# Patient Record
Sex: Male | Born: 1985 | Race: Black or African American | Hispanic: No | Marital: Single | Smoking: Current every day smoker
Health system: Southern US, Community
[De-identification: ages and names within clinical notes are randomized; demographics above are authoritative.]

## PROBLEM LIST (undated history)

## (undated) DIAGNOSIS — F32A Depression, unspecified: Secondary | ICD-10-CM

## (undated) DIAGNOSIS — K649 Unspecified hemorrhoids: Secondary | ICD-10-CM

## (undated) DIAGNOSIS — F419 Anxiety disorder, unspecified: Secondary | ICD-10-CM

## (undated) DIAGNOSIS — F329 Major depressive disorder, single episode, unspecified: Secondary | ICD-10-CM

## (undated) HISTORY — PX: CARDIAC SURGERY: SHX584

---
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
## 2008-06-17 ENCOUNTER — Emergency Department (HOSPITAL_COMMUNITY): Admission: EM | Admit: 2008-06-17 | Discharge: 2008-06-17 | Payer: Self-pay | Admitting: Emergency Medicine

## 2011-12-22 ENCOUNTER — Emergency Department (HOSPITAL_COMMUNITY)
Admission: EM | Admit: 2011-12-22 | Discharge: 2011-12-22 | Disposition: A | Discharge status: Home / Self Care | Payer: No Typology Code available for payment source | Attending: Emergency Medicine | Admitting: Emergency Medicine

## 2011-12-22 ENCOUNTER — Emergency Department (HOSPITAL_COMMUNITY): Payer: No Typology Code available for payment source

## 2011-12-22 ENCOUNTER — Encounter (HOSPITAL_COMMUNITY): Payer: Self-pay | Admitting: Emergency Medicine

## 2011-12-22 DIAGNOSIS — M549 Dorsalgia, unspecified: Secondary | ICD-10-CM

## 2011-12-22 DIAGNOSIS — M545 Low back pain, unspecified: Secondary | ICD-10-CM | POA: Insufficient documentation

## 2011-12-22 DIAGNOSIS — Y9389 Activity, other specified: Secondary | ICD-10-CM | POA: Insufficient documentation

## 2011-12-22 DIAGNOSIS — Y92410 Unspecified street and highway as the place of occurrence of the external cause: Secondary | ICD-10-CM | POA: Insufficient documentation

## 2011-12-22 DIAGNOSIS — M542 Cervicalgia: Secondary | ICD-10-CM

## 2011-12-22 DIAGNOSIS — M949 Disorder of cartilage, unspecified: Secondary | ICD-10-CM | POA: Insufficient documentation

## 2011-12-22 DIAGNOSIS — F17200 Nicotine dependence, unspecified, uncomplicated: Secondary | ICD-10-CM | POA: Insufficient documentation

## 2011-12-22 DIAGNOSIS — M899 Disorder of bone, unspecified: Secondary | ICD-10-CM | POA: Insufficient documentation

## 2011-12-22 MED ORDER — IBUPROFEN 800 MG PO TABS: 800.0000 mg | ORAL_TABLET | Freq: Three times a day (TID) | ORAL | Status: DC | PRN

## 2011-12-22 MED ORDER — DIAZEPAM 5 MG PO TABS: 5.0000 mg | ORAL_TABLET | Freq: Three times a day (TID) | ORAL | Status: DC | PRN
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
## 2011-12-26 NOTE — ED Provider Notes (Signed)
Medical screening examination/treatment/procedure(s) were performed by non-physician practitioner and as supervising physician I was immediately available for consultation/collaboration.  Tacora Athanas, MD 12/26/11 2037 

## 2011-12-29 ENCOUNTER — Emergency Department (INDEPENDENT_AMBULATORY_CARE_PROVIDER_SITE_OTHER)
Admission: EM | Admit: 2011-12-29 | Discharge: 2011-12-29 | Disposition: A | Discharge status: Home / Self Care | Payer: Medicaid Other | Source: Home / Self Care | Attending: Emergency Medicine | Admitting: Emergency Medicine

## 2011-12-29 ENCOUNTER — Encounter (HOSPITAL_COMMUNITY): Payer: Self-pay | Admitting: Emergency Medicine

## 2011-12-29 DIAGNOSIS — S29019A Strain of muscle and tendon of unspecified wall of thorax, initial encounter: Secondary | ICD-10-CM

## 2011-12-29 DIAGNOSIS — S139XXA Sprain of joints and ligaments of unspecified parts of neck, initial encounter: Secondary | ICD-10-CM

## 2011-12-29 DIAGNOSIS — S161XXA Strain of muscle, fascia and tendon at neck level, initial encounter: Secondary | ICD-10-CM

## 2011-12-29 DIAGNOSIS — S239XXA Sprain of unspecified parts of thorax, initial encounter: Secondary | ICD-10-CM

## 2011-12-29 DIAGNOSIS — S39012A Strain of muscle, fascia and tendon of lower back, initial encounter: Secondary | ICD-10-CM

## 2011-12-29 DIAGNOSIS — S335XXA Sprain of ligaments of lumbar spine, initial encounter: Secondary | ICD-10-CM

## 2011-12-29 HISTORY — DX: Depression, unspecified: F32.A

## 2011-12-29 HISTORY — DX: Anxiety disorder, unspecified: F41.9

## 2011-12-29 HISTORY — DX: Major depressive disorder, single episode, unspecified: F32.9

## 2011-12-29 MED ORDER — TRAMADOL HCL 50 MG PO TABS
100.0000 mg | ORAL_TABLET | Freq: Three times a day (TID) | ORAL | Status: DC | PRN
Start: 2011-12-29 — End: 2012-03-24

## 2011-12-29 MED ORDER — METHOCARBAMOL 500 MG PO TABS: 500.0000 mg | ORAL_TABLET | Freq: Three times a day (TID) | ORAL | Status: DC

## 2011-12-29 MED ORDER — MELOXICAM 15 MG PO TABS: 15.0000 mg | ORAL_TABLET | Freq: Every day | ORAL | Status: DC
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
## 2012-01-05 ENCOUNTER — Other Ambulatory Visit (HOSPITAL_COMMUNITY)
Admission: RE | Admit: 2012-01-05 | Discharge: 2012-01-05 | Disposition: A | Discharge status: Home / Self Care | Payer: Medicare Other | Source: Ambulatory Visit | Attending: Emergency Medicine | Admitting: Emergency Medicine

## 2012-01-05 ENCOUNTER — Emergency Department (INDEPENDENT_AMBULATORY_CARE_PROVIDER_SITE_OTHER)
Admission: EM | Admit: 2012-01-05 | Discharge: 2012-01-05 | Disposition: A | Discharge status: Home / Self Care | Payer: Medicaid Other | Source: Home / Self Care | Attending: Emergency Medicine | Admitting: Emergency Medicine

## 2012-01-05 ENCOUNTER — Encounter (HOSPITAL_COMMUNITY): Payer: Self-pay | Admitting: *Deleted

## 2012-01-05 DIAGNOSIS — Z113 Encounter for screening for infections with a predominantly sexual mode of transmission: Secondary | ICD-10-CM | POA: Insufficient documentation

## 2012-01-05 DIAGNOSIS — A630 Anogenital (venereal) warts: Secondary | ICD-10-CM

## 2012-01-05 LAB — RPR: RPR Ser Ql: NONREACTIVE

## 2012-01-05 MED ORDER — PODOFILOX 0.5 % EX SOLN: CUTANEOUS | Status: DC
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
## 2012-01-05 NOTE — ED Provider Notes (Signed)
Chief Complaint  Patient presents with  . Exposure to STD    History of Present Illness:   Mr. Frick is a 26 year old male who presents with a two-day history of lesions on the penis. These are not painful or itchy. He denies any urethral discharge or dysuria. He has had no ulcerations or blisters. No inguinal adenopathy, no swelling or pain in the testes. He denies systemic symptoms such as fever, chills, headache, stiff neck, skin rash, or joint pain. He has had no prior history of STDs. He is sexually active with inconsistent use of condoms. He has one male partner.  Review of Systems:  Other than noted above, the patient denies any of the following symptoms: Systemic:  No fevers chills, aches, weight loss, arthralgias, myalgias, or adenopathy. GI:  No abdominal pain, nausea or vomiting. GU:  No dysuria, penile pain, discharge, itching, dysuria, genital lesions, testicular pain or swelling. Skin:  No rash or itching.  PMFSH:  Past medical history, family history, social history, meds, and allergies were reviewed.  Physical Exam:   Vital signs:  BP 129/69  Pulse 88  Temp 98.4 F (36.9 C) (Oral)  Resp 24  SpO2 99% Gen:  Alert, oriented, in no distress. Abdomen:  Soft and flat, non-distended, and non-tender.  No organomegaly or mass. Genital:  He has 2 verrucous lesions on the left side of the penis, one distally and one proximally. These are nontender to palpation. There no ulcerations, no urethral discharge, and no other genital lesions. Testes are normal without any tenderness, swelling, or masses. There is no inguinal lymphadenopathy. Skin:  Warm and dry.  No rash.   Other Labs Obtained at Urgent Care Center:  Urine for DNA probe for gonorrhea, Chlamydia, and Trichomonas were obtained as well as serologies for HIV and syphilis.  Results are pending at this time and we will call about any positive results.  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  Plan:    1.  The following meds were prescribed:   New Prescriptions   PODOFILOX (CONDYLOX) 0.5 % EXTERNAL SOLUTION    Apply BID for 3 days, leave off for 4 days, repeat for 4 weeks.   2.  The patient was instructed in symptomatic care and handouts were given. 3.  The patient was told to return if becoming worse in any way, if no better in 3 or 4 days, and given some red flag symptoms that would indicate earlier return. 4.  The patient was instructed to inform all sexual contacts, avoid intercourse completely for 2 weeks and then only with a condom.  The patient was told that we would call about all abnormal lab results, and that we would need to report certain kinds of infection to the health department.    Reuben Likes, MD 01/05/12 1640

## 2012-03-24 ENCOUNTER — Emergency Department (HOSPITAL_COMMUNITY)
Admission: EM | Admit: 2012-03-24 | Discharge: 2012-03-25 | Disposition: A | Discharge status: Home / Self Care | Payer: Medicare Other | Attending: Emergency Medicine | Admitting: Emergency Medicine

## 2012-03-24 ENCOUNTER — Encounter (HOSPITAL_COMMUNITY): Payer: Self-pay | Admitting: Emergency Medicine

## 2012-03-24 DIAGNOSIS — F3289 Other specified depressive episodes: Secondary | ICD-10-CM | POA: Insufficient documentation

## 2012-03-24 DIAGNOSIS — S63509A Unspecified sprain of unspecified wrist, initial encounter: Secondary | ICD-10-CM | POA: Insufficient documentation

## 2012-03-24 DIAGNOSIS — F17200 Nicotine dependence, unspecified, uncomplicated: Secondary | ICD-10-CM | POA: Insufficient documentation

## 2012-03-24 DIAGNOSIS — Z23 Encounter for immunization: Secondary | ICD-10-CM | POA: Insufficient documentation

## 2012-03-24 DIAGNOSIS — Z79899 Other long term (current) drug therapy: Secondary | ICD-10-CM | POA: Insufficient documentation

## 2012-03-24 DIAGNOSIS — Y92009 Unspecified place in unspecified non-institutional (private) residence as the place of occurrence of the external cause: Secondary | ICD-10-CM | POA: Insufficient documentation

## 2012-03-24 DIAGNOSIS — X58XXXA Exposure to other specified factors, initial encounter: Secondary | ICD-10-CM | POA: Insufficient documentation

## 2012-03-24 DIAGNOSIS — Y9383 Activity, rough housing and horseplay: Secondary | ICD-10-CM | POA: Insufficient documentation

## 2012-03-24 DIAGNOSIS — F411 Generalized anxiety disorder: Secondary | ICD-10-CM | POA: Insufficient documentation

## 2012-03-24 DIAGNOSIS — F329 Major depressive disorder, single episode, unspecified: Secondary | ICD-10-CM | POA: Insufficient documentation

## 2012-03-24 DIAGNOSIS — T148XXA Other injury of unspecified body region, initial encounter: Secondary | ICD-10-CM | POA: Insufficient documentation

## 2012-03-24 DIAGNOSIS — IMO0002 Reserved for concepts with insufficient information to code with codable children: Secondary | ICD-10-CM | POA: Insufficient documentation
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
## 2012-03-25 ENCOUNTER — Emergency Department (HOSPITAL_COMMUNITY): Payer: Medicare Other

## 2012-03-25 MED ORDER — IBUPROFEN 800 MG PO TABS
800.0000 mg | ORAL_TABLET | Freq: Once | ORAL | Status: AC
  Administered 2012-03-25: 800 mg via ORAL
  Filled 2012-03-25: qty 1

## 2012-03-25 MED ORDER — TETANUS-DIPHTH-ACELL PERTUSSIS 5-2.5-18.5 LF-MCG/0.5 IM SUSP
0.5000 mL | Freq: Once | INTRAMUSCULAR | Status: AC
  Administered 2012-03-25: 0.5 mL via INTRAMUSCULAR
  Filled 2012-03-25: qty 0.5

## 2012-03-25 MED ORDER — IBUPROFEN 800 MG PO TABS: 800.0000 mg | ORAL_TABLET | Freq: Three times a day (TID) | ORAL | Status: DC
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
## 2012-03-25 NOTE — Progress Notes (Signed)
Orthopedic Tech Progress Note Patient Details:  Jose Gibson 1985-11-11 [id number]  Ortho Devices Type of Ortho Device: Wrist splint   Haskell Flirt 03/25/2012, 1:08 AM

## 2012-03-25 NOTE — ED Notes (Signed)
Pt does not want anything to drink

## 2012-03-25 NOTE — ED Notes (Signed)
Ortho paged for wrist splint 

## 2013-02-14 DIAGNOSIS — F259 Schizoaffective disorder, unspecified: Secondary | ICD-10-CM | POA: Diagnosis not present

## 2013-05-17 DIAGNOSIS — F259 Schizoaffective disorder, unspecified: Secondary | ICD-10-CM | POA: Diagnosis not present

## 2013-07-12 DIAGNOSIS — F259 Schizoaffective disorder, unspecified: Secondary | ICD-10-CM | POA: Diagnosis not present

## 2013-08-08 IMAGING — CR DG CHEST 2V
2 series · 2 of 2 positions shown · non-contrast
Comparison: None.

CLINICAL DATA: Status post fall; chest pain.

CHEST - 2 VIEW

[w chest pa]
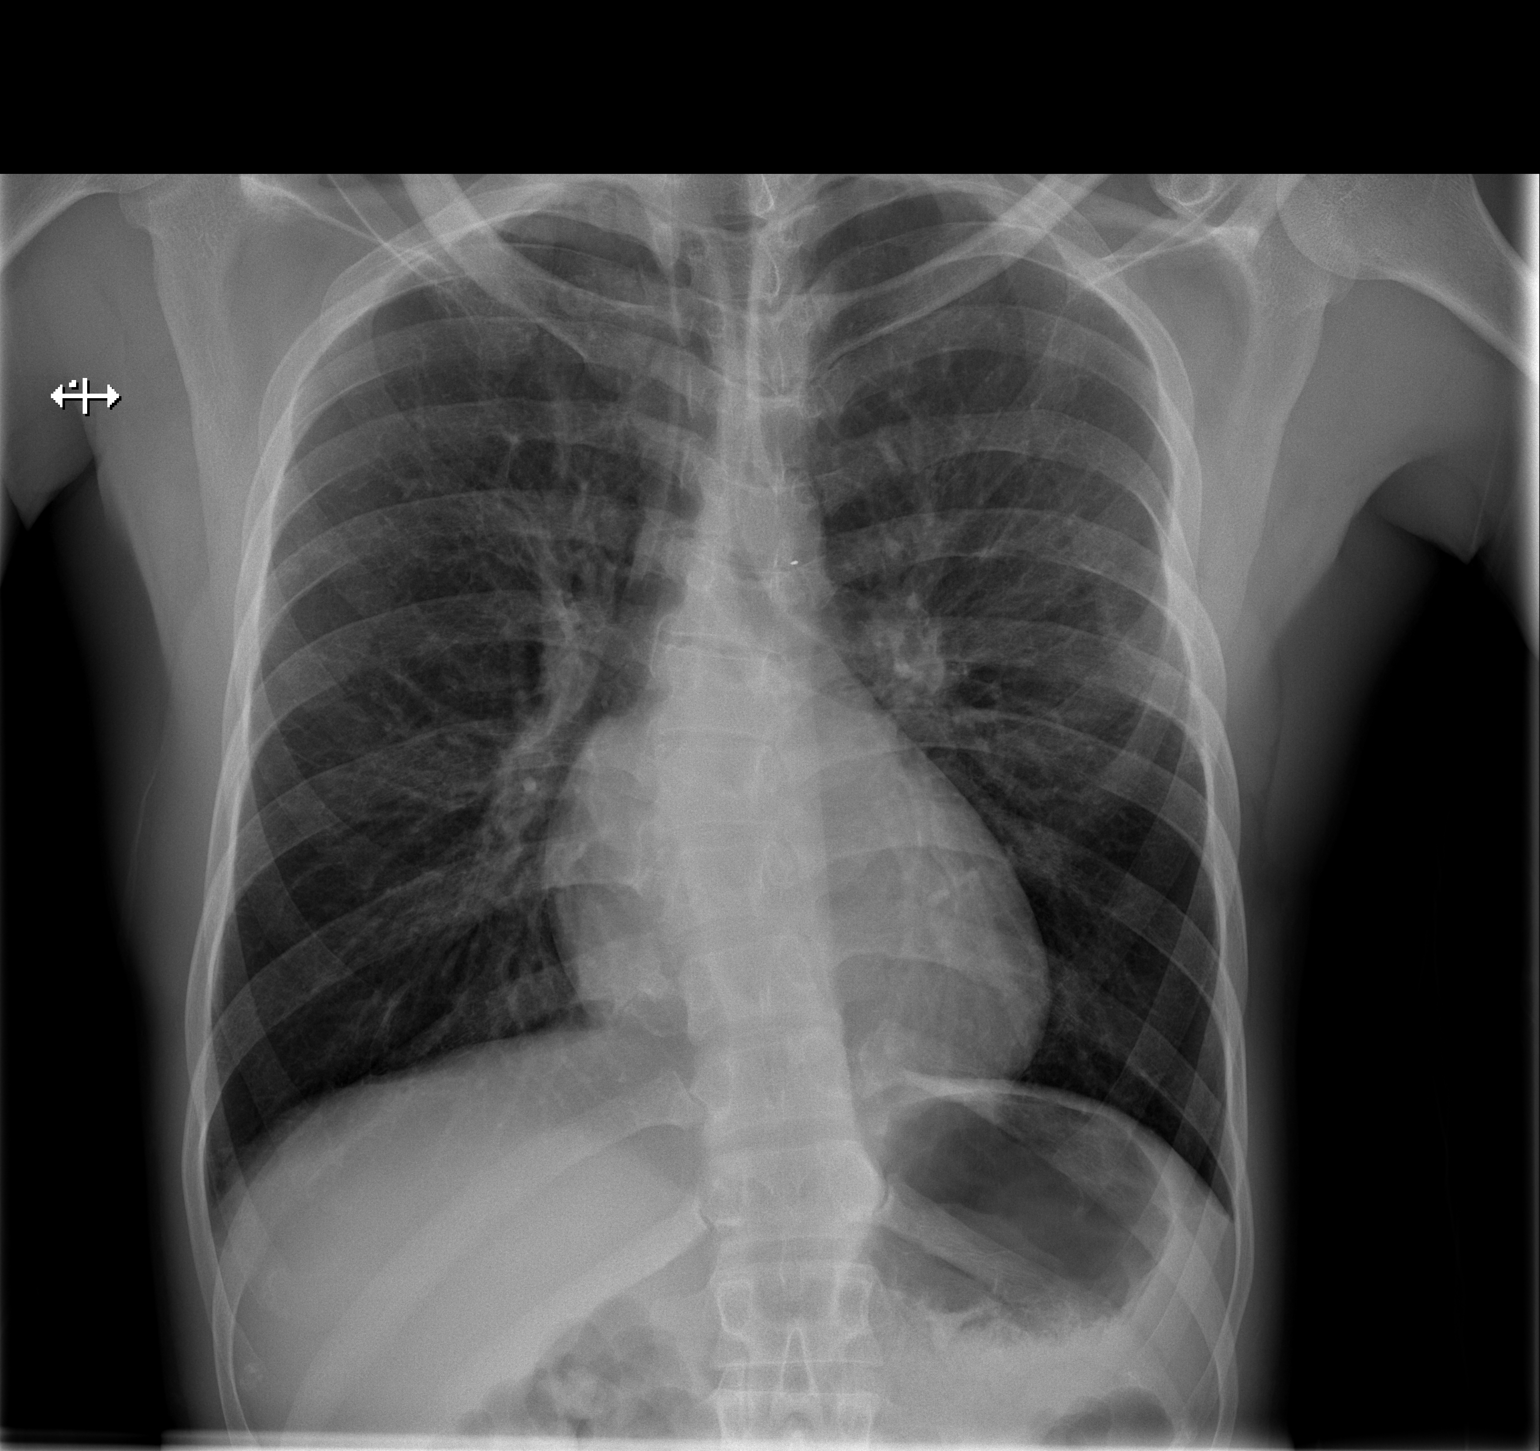

[w chest lat]
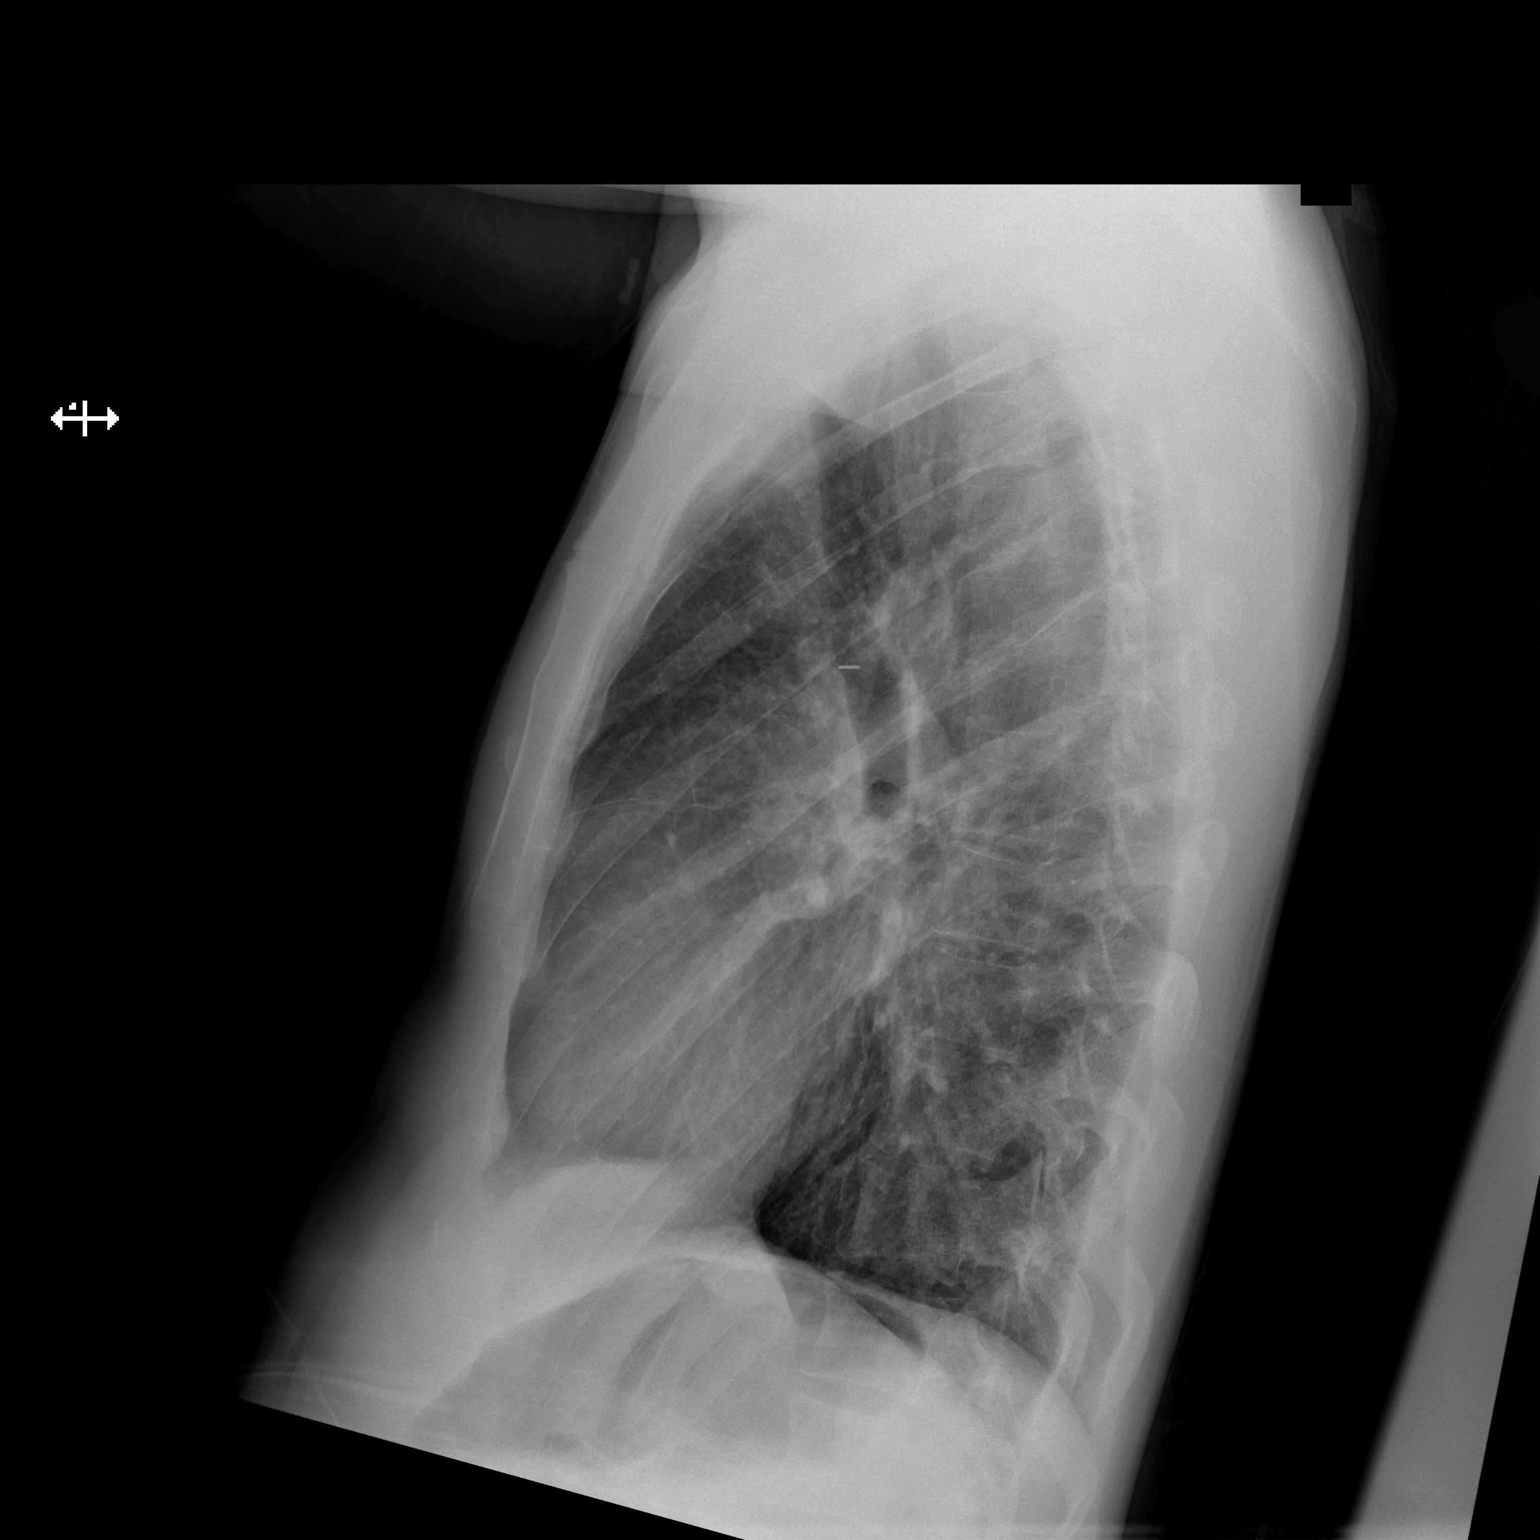

[2 of 2 positions shown; findings below may reference images not displayed]

FINDINGS: The lungs are well-aerated and clear.  There is no
evidence of focal opacification, pleural effusion or pneumothorax.

The heart is normal in size; the mediastinal contour is within
normal limits.  No acute osseous abnormalities are seen.  Minimal
right convex thoracic scoliosis is noted.
IMPRESSION: 1.  No acute cardiopulmonary process seen; no displaced rib
fractures identified.
2.  Minimal right convex thoracic scoliosis noted.

## 2013-09-11 DIAGNOSIS — F259 Schizoaffective disorder, unspecified: Secondary | ICD-10-CM | POA: Diagnosis not present

## 2014-04-06 DIAGNOSIS — F251 Schizoaffective disorder, depressive type: Secondary | ICD-10-CM | POA: Diagnosis not present

## 2014-08-30 ENCOUNTER — Encounter (HOSPITAL_COMMUNITY): Payer: Self-pay | Admitting: Emergency Medicine

## 2014-08-30 ENCOUNTER — Emergency Department (HOSPITAL_COMMUNITY)
Admission: EM | Admit: 2014-08-30 | Discharge: 2014-08-30 | Disposition: A | Discharge status: Home / Self Care | Payer: Medicare Other | Attending: Emergency Medicine | Admitting: Emergency Medicine

## 2014-08-30 DIAGNOSIS — Z720 Tobacco use: Secondary | ICD-10-CM | POA: Insufficient documentation

## 2014-08-30 DIAGNOSIS — Z79899 Other long term (current) drug therapy: Secondary | ICD-10-CM | POA: Diagnosis not present

## 2014-08-30 DIAGNOSIS — K649 Unspecified hemorrhoids: Secondary | ICD-10-CM

## 2014-08-30 DIAGNOSIS — F419 Anxiety disorder, unspecified: Secondary | ICD-10-CM | POA: Diagnosis not present

## 2014-08-30 DIAGNOSIS — K625 Hemorrhage of anus and rectum: Secondary | ICD-10-CM

## 2014-08-30 DIAGNOSIS — K644 Residual hemorrhoidal skin tags: Secondary | ICD-10-CM | POA: Diagnosis not present

## 2014-08-30 DIAGNOSIS — F329 Major depressive disorder, single episode, unspecified: Secondary | ICD-10-CM | POA: Insufficient documentation

## 2014-08-30 DIAGNOSIS — K6289 Other specified diseases of anus and rectum: Secondary | ICD-10-CM | POA: Diagnosis not present

## 2014-08-30 LAB — COMPREHENSIVE METABOLIC PANEL
ALK PHOS: 77 U/L (ref 38–126)
ALT: 24 U/L (ref 17–63)
AST: 26 U/L (ref 15–41)
Albumin: 4.5 g/dL (ref 3.5–5.0)
Anion gap: 9 (ref 5–15)
BILIRUBIN TOTAL: 0.6 mg/dL (ref 0.3–1.2)
BUN: 9 mg/dL (ref 6–20)
CALCIUM: 9.7 mg/dL (ref 8.9–10.3)
CHLORIDE: 104 mmol/L (ref 101–111)
CO2: 25 mmol/L (ref 22–32)
Creatinine, Ser: 0.93 mg/dL (ref 0.61–1.24)
GLUCOSE: 89 mg/dL (ref 65–99)
POTASSIUM: 3.9 mmol/L (ref 3.5–5.1)
SODIUM: 138 mmol/L (ref 135–145)
Total Protein: 8.3 g/dL — ABNORMAL HIGH (ref 6.5–8.1)

## 2014-08-30 LAB — TYPE AND SCREEN
ABO/RH(D): A POS
Antibody Screen: NEGATIVE

## 2014-08-30 LAB — CBC
HCT: 44.7 % (ref 39.0–52.0)
HEMOGLOBIN: 14.9 g/dL (ref 13.0–17.0)
MCH: 27 pg (ref 26.0–34.0)
MCHC: 33.3 g/dL (ref 30.0–36.0)
MCV: 81 fL (ref 78.0–100.0)
Platelets: 209 10*3/uL (ref 150–400)
RBC: 5.52 MIL/uL (ref 4.22–5.81)
RDW: 14.3 % (ref 11.5–15.5)
WBC: 4.7 10*3/uL (ref 4.0–10.5)

## 2014-08-30 LAB — POC OCCULT BLOOD, ED: FECAL OCCULT BLD: NEGATIVE

## 2014-08-30 LAB — ABO/RH: ABO/RH(D): A POS

## 2014-08-30 NOTE — Discharge Instructions (Signed)
Bloody Stools °Bloody stools means there is blood in your poop (stool). It is a sign that there is a problem somewhere in the digestive system. It is important for your doctor to find the cause of your bleeding, so the problem can be treated.  °HOME CARE °· Only take medicine as told by your doctor. °· Eat foods with fiber (prunes, bran cereals). °· Drink enough fluids to keep your pee (urine) clear or pale yellow. °· Sit in warm water (sitz bath) for 10 to 15 minutes as told by your doctor. °· Know how to take your medicines (enemas, suppositories) if advised by your doctor. °· Watch for signs that you are getting better or getting worse. °GET HELP RIGHT AWAY IF:  °· You are not getting better. °· You start to get better but then get worse again. °· You have new problems. °· You have severe bleeding from the place where poop comes out (rectum) that does not stop. °· You throw up (vomit) blood. °· You feel weak or pass out (faint). °· You have a fever. °MAKE SURE YOU:  °· Understand these instructions. °· Will watch your condition. °· Will get help right away if you are not doing well or get worse. °Document Released: 12/24/2008 Document Revised: 03/30/2011 Document Reviewed: 05/23/2010 °[employer]® Patient Information ©2015 [employer]. This information is not intended to replace advice given to you by your health care provider. Make sure you discuss any questions you have with your health care provider. ° °

## 2014-08-30 NOTE — ED Provider Notes (Signed)
CSN: [id number]     Arrival date & time 08/30/14  1422 History   This chart was scribed for non-physician practitioner, Teressa Lower, NP working with Benjiman Core, MD, by Jarvis Morgan, ED Scribe. This patient was seen in room TR02C/TR02C and the patient's care was started at 3:49 PM.   Chief Complaint  Patient presents with  . Rectal Bleeding    The history is provided by the patient. No language interpreter was used.    HPI Comments: Jose Gibson is a 29 y.o. male who presents to the Emergency Department complaining of intermittent, mild, rectal bleeding onset today. Pt states when he used the bathroom there was dark red blood in the toilet bowl, tissue and on the floor. He denies any h/o of rectal bleeding or constipation. Pt denies any daily medication use. He denies any abdominal pain    Past Medical History  Diagnosis Date  . Depression   . Anxiety    Past Surgical History  Procedure Laterality Date  . Cardiac surgery      5 mos. old- does not know what they did   History reviewed. No pertinent family history. Social History  Substance Use Topics  . Smoking status: Current Every Day Smoker -- 2.00 packs/day    Types: Cigarettes  . Smokeless tobacco: None  . Alcohol Use: 1.2 oz/week    2 Cans of beer per week    Review of Systems  Gastrointestinal: Positive for hematochezia, anal bleeding and rectal pain. Negative for abdominal pain and constipation.  All other systems reviewed and are negative.     Allergies  Review of patient's allergies indicates no known allergies.  Home Medications   Prior to Admission medications   Medication Sig Start Date End Date Taking? Authorizing Provider  ARIPiprazole (ABILIFY PO) Take by mouth.    Historical Provider, MD  BuPROPion HCl (WELLBUTRIN PO) Take by mouth.    Historical Provider, MD  ibuprofen (ADVIL,MOTRIN) 800 MG tablet Take 1 tablet (800 mg total) by mouth 3 (three) times daily. 03/25/12   Sunnie Nielsen, MD    Triage Vitals: BP 128/74 mmHg  Pulse 74  Temp(Src) 97.6 F (36.4 C) (Oral)  Resp 18  SpO2 98%  Physical Exam  Constitutional: He is oriented to person, place, and time. He appears well-developed and well-nourished. No distress.  HENT:  Head: Normocephalic and atraumatic.  Eyes: Conjunctivae and EOM are normal.  Neck: Neck supple. No tracheal deviation present.  Cardiovascular: Normal rate.   Pulmonary/Chest: Effort normal. No respiratory distress.  Abdominal: Soft. Bowel sounds are normal. There is no tenderness.  Genitourinary: Rectal exam shows external hemorrhoid.  Stool normal color  Musculoskeletal: Normal range of motion.  Neurological: He is alert and oriented to person, place, and time.  Skin: Skin is warm and dry.  Psychiatric: He has a normal mood and affect. His behavior is normal.  Nursing note and vitals reviewed.   ED Course  Procedures (including critical care time)  DIAGNOSTIC STUDIES: Oxygen Saturation is 98% on RA, normal by my interpretation.    COORDINATION OF CARE:    Labs Review Labs Reviewed  COMPREHENSIVE METABOLIC PANEL - Abnormal; Notable for the following:    Total Protein 8.3 (*)    All other components within normal limits  CBC  POC OCCULT BLOOD, ED  TYPE AND SCREEN  ABO/RH    Imaging Review No results found.   EKG Interpretation None      MDM   Final diagnoses:  Hemorrhoids, unspecified  hemorrhoid type  Rectal bleeding    Pt given gi follow up. Pt has normal blood counts and stool normal in color  I personally performed the services described in this documentation, which was scribed in my presence. The recorded information has been reviewed and is accurate.    Teressa Lower, NP 08/30/14 1624  Benjiman Core, MD 09/03/14 [phone]

## 2014-08-30 NOTE — ED Notes (Signed)
States had "large amount of dark red blood after bowel movement in commode" has abd cramping --

## 2014-09-28 DIAGNOSIS — F251 Schizoaffective disorder, depressive type: Secondary | ICD-10-CM | POA: Diagnosis not present

## 2015-05-27 ENCOUNTER — Emergency Department (HOSPITAL_COMMUNITY)
Admission: EM | Admit: 2015-05-27 | Discharge: 2015-05-27 | Disposition: A | Discharge status: Home / Self Care | Payer: Medicare Other | Attending: Emergency Medicine | Admitting: Emergency Medicine

## 2015-05-27 ENCOUNTER — Encounter (HOSPITAL_COMMUNITY): Payer: Self-pay | Admitting: *Deleted

## 2015-05-27 DIAGNOSIS — F17210 Nicotine dependence, cigarettes, uncomplicated: Secondary | ICD-10-CM | POA: Insufficient documentation

## 2015-05-27 DIAGNOSIS — N481 Balanitis: Secondary | ICD-10-CM | POA: Insufficient documentation

## 2015-05-27 DIAGNOSIS — F329 Major depressive disorder, single episode, unspecified: Secondary | ICD-10-CM | POA: Diagnosis not present

## 2015-05-27 DIAGNOSIS — F419 Anxiety disorder, unspecified: Secondary | ICD-10-CM | POA: Diagnosis not present

## 2015-05-27 DIAGNOSIS — R21 Rash and other nonspecific skin eruption: Secondary | ICD-10-CM | POA: Diagnosis present

## 2015-05-27 DIAGNOSIS — Z79899 Other long term (current) drug therapy: Secondary | ICD-10-CM | POA: Diagnosis not present

## 2015-05-27 MED ORDER — NYSTATIN 100000 UNIT/GM EX CREA: 1.0000 "application " | TOPICAL_CREAM | Freq: Four times a day (QID) | CUTANEOUS | Status: DC

## 2015-05-27 NOTE — Discharge Instructions (Signed)
Balanitis °Balanitis is inflammation of the head of the penis (glans).  °CAUSES  °Balanitis has multiple causes, both infectious and noninfectious. Often balanitis is the result of poor personal hygiene, especially in uncircumcised males. Without adequate washing, viruses, bacteria, and yeast collect between the foreskin and the glans. This can cause an infection. Lack of air and irritation from a normal secretion called smegma contribute to the cause in uncircumcised males. Other causes include: °· Chemical irritation from the use of certain soaps and shower gels (especially soaps with perfumes), condoms, personal lubricants, petroleum jelly, spermicides, and fabric conditioners. °· Skin conditions, such as eczema, dermatitis, and psoriasis. °· Allergies to drugs, such as tetracycline and sulfa. °· Certain medical conditions, including liver cirrhosis, congestive heart failure, and kidney disease. °· Morbid obesity. °RISK FACTORS °· Diabetes mellitus. °· A tight foreskin that is difficult to pull back past the glans (phimosis). °· Sex without the use of a condom. °SIGNS AND SYMPTOMS  °Symptoms may include: °· Discharge coming from under the foreskin. °· Tenderness. °· Itching and inability to get an erection (because of the pain). °· Redness and a rash. °· Sores on the glans and on the foreskin. °DIAGNOSIS °Diagnosis of balanitis is confirmed through a physical exam. °TREATMENT °The treatment is based on the cause of the balanitis. Treatment may include: °· Frequent cleansing. °· Keeping the glans and foreskin dry. °· Use of medicines such as creams, pain medicines, antibiotics, or medicines to treat fungal infections. °· Sitz baths. °If the irritation has caused a scar on the foreskin that prevents easy retraction, a circumcision may be recommended.  °HOME CARE INSTRUCTIONS °· Sex should be avoided until the condition has cleared. °MAKE SURE YOU: °· Understand these instructions. °· Will watch your  condition. °· Will get help right away if you are not doing well or get worse. °  °This information is not intended to replace advice given to you by your health care provider. Make sure you discuss any questions you have with your health care provider. °  °Document Released: 05/24/2008 Document Revised: 01/10/2013 Document Reviewed: 06/27/2012 °Elsevier Interactive Patient Education ©2016 [employer] ° °

## 2015-05-27 NOTE — ED Notes (Signed)
Declined W/C at D/C and was escorted to lobby by RN. 

## 2015-05-27 NOTE — ED Notes (Signed)
Pt reports having recent yeast infection and has dry itchy skin in groin area. Denies penile discharge or pain with urination.

## 2015-05-27 NOTE — ED Provider Notes (Signed)
CSN: [id number]     Arrival date & time 05/27/15  0957 History  By signing my name below, I, Sonum Patel, attest that this documentation has been prepared under the direction and in the presence of Fayrene Helper, PA-C. Electronically Signed: Sonum Patel, Neurosurgeon. 05/27/2015. 11:27 AM.    Chief Complaint  Patient presents with  . Rash    The history is provided by the patient. No language interpreter was used.     HPI Comments: Jose Gibson is a 30 y.o. male who presents to the Emergency Department complaining of 2 days of constant dry, itchy area on the penis. He denies similar symptoms in the past. He reports 1 sexual partner for the last 6 months with sometimes unprotected intercourse. He denies penile discharge, dysuria, hematuria, fever, abdominal pain. He denies history of STD's or HIV.    Past Medical History  Diagnosis Date  . Depression   . Anxiety    Past Surgical History  Procedure Laterality Date  . Cardiac surgery      5 mos. old- does not know what they did   History reviewed. No pertinent family history. Social History  Substance Use Topics  . Smoking status: Current Every Day Smoker -- 2.00 packs/day    Types: Cigarettes  . Smokeless tobacco: None  . Alcohol Use: 1.2 oz/week    2 Cans of beer per week    Review of Systems  Genitourinary: Positive for penile pain (irritation). Negative for dysuria, hematuria, discharge, penile swelling, scrotal swelling and testicular pain.      Allergies  Review of patient's allergies indicates no known allergies.  Home Medications   Prior to Admission medications   Medication Sig Start Date End Date Taking? Authorizing Provider  ARIPiprazole (ABILIFY PO) Take by mouth.    Historical Provider, MD  BuPROPion HCl (WELLBUTRIN PO) Take by mouth.    Historical Provider, MD  ibuprofen (ADVIL,MOTRIN) 800 MG tablet Take 1 tablet (800 mg total) by mouth 3 (three) times daily. 03/25/12   Sunnie Nielsen, MD   BP 142/76 mmHg  Pulse  71  Temp(Src) 98.4 F (36.9 C)  Resp 20  SpO2 98% Physical Exam  Constitutional: He is oriented to person, place, and time. He appears well-developed and well-nourished.  HENT:  Head: Normocephalic and atraumatic.  Cardiovascular: Normal rate.   Pulmonary/Chest: Effort normal.  Abdominal: Hernia confirmed negative in the right inguinal area and confirmed negative in the left inguinal area.  Genitourinary: Testes normal. Uncircumcised.  Uncircumcised with evidence of balanitis at the foreskin. No inguinal lymphadenopathy or inguinal hernia noted. Testicles non tender with normal lie. Scrotum non tender   Neurological: He is alert and oriented to person, place, and time.  Skin: Skin is warm and dry.  Psychiatric: He has a normal mood and affect.  Nursing note and vitals reviewed.   ED Course  Procedures (including critical care time) . DIAGNOSTIC STUDIES: Oxygen Saturation is 98% on RA, normal by my interpretation.    COORDINATION OF CARE: 11:35 AM Will discharge home with nystatin cream. Discussed treatment plan with pt at bedside and pt agreed to plan.   Evidence of balanitis.  No other concerning finding.    MDM   Final diagnoses:  Balanitis    BP 142/76 mmHg  Pulse 71  Temp(Src) 98.4 F (36.9 C)  Resp 20  SpO2 98%   I personally performed the services described in this documentation, which was scribed in my presence. The recorded information has been reviewed and  is accurate.     Fayrene HelperBowie Ragna Kramlich, PA-C 05/27/15 1248  Alvira MondayErin Schlossman, MD 05/28/15 1311

## 2015-08-07 DIAGNOSIS — F251 Schizoaffective disorder, depressive type: Secondary | ICD-10-CM | POA: Diagnosis not present

## 2015-11-20 DIAGNOSIS — F251 Schizoaffective disorder, depressive type: Secondary | ICD-10-CM | POA: Diagnosis not present

## 2016-03-24 DIAGNOSIS — F251 Schizoaffective disorder, depressive type: Secondary | ICD-10-CM | POA: Diagnosis not present

## 2016-06-04 DIAGNOSIS — F251 Schizoaffective disorder, depressive type: Secondary | ICD-10-CM | POA: Diagnosis not present

## 2016-08-27 DIAGNOSIS — F251 Schizoaffective disorder, depressive type: Secondary | ICD-10-CM | POA: Diagnosis not present

## 2016-12-24 DIAGNOSIS — F251 Schizoaffective disorder, depressive type: Secondary | ICD-10-CM | POA: Diagnosis not present

## 2017-03-02 DIAGNOSIS — F251 Schizoaffective disorder, depressive type: Secondary | ICD-10-CM | POA: Diagnosis not present

## 2017-05-13 DIAGNOSIS — F251 Schizoaffective disorder, depressive type: Secondary | ICD-10-CM | POA: Diagnosis not present

## 2017-07-29 DIAGNOSIS — F251 Schizoaffective disorder, depressive type: Secondary | ICD-10-CM | POA: Diagnosis not present

## 2017-10-27 DIAGNOSIS — F251 Schizoaffective disorder, depressive type: Secondary | ICD-10-CM | POA: Diagnosis not present

## 2017-12-24 ENCOUNTER — Encounter (HOSPITAL_COMMUNITY): Payer: Self-pay

## 2017-12-24 ENCOUNTER — Ambulatory Visit (HOSPITAL_COMMUNITY)
Admission: EM | Admit: 2017-12-24 | Discharge: 2017-12-24 | Disposition: A | Discharge status: Home / Self Care | Payer: Medicare Other | Attending: Family Medicine | Admitting: Family Medicine

## 2017-12-24 DIAGNOSIS — S61012A Laceration without foreign body of left thumb without damage to nail, initial encounter: Secondary | ICD-10-CM

## 2017-12-24 DIAGNOSIS — Z23 Encounter for immunization: Secondary | ICD-10-CM | POA: Diagnosis not present

## 2017-12-24 DIAGNOSIS — W268XXA Contact with other sharp object(s), not elsewhere classified, initial encounter: Secondary | ICD-10-CM

## 2017-12-24 MED ORDER — TETANUS-DIPHTH-ACELL PERTUSSIS 5-2.5-18.5 LF-MCG/0.5 IM SUSP
INTRAMUSCULAR | Status: AC
  Filled 2017-12-24: qty 0.5

## 2017-12-24 MED ORDER — LIDOCAINE-EPINEPHRINE-TETRACAINE (LET) SOLUTION
NASAL | Status: AC
  Filled 2017-12-24: qty 3

## 2017-12-24 MED ORDER — LIDOCAINE-EPINEPHRINE-TETRACAINE (LET) SOLUTION
3.0000 mL | Freq: Once | NASAL | Status: AC
  Administered 2017-12-24: 3 mL via TOPICAL

## 2017-12-24 MED ORDER — TETANUS-DIPHTH-ACELL PERTUSSIS 5-2.5-18.5 LF-MCG/0.5 IM SUSP
0.5000 mL | Freq: Once | INTRAMUSCULAR | Status: AC
  Administered 2017-12-24: 0.5 mL via INTRAMUSCULAR

## 2017-12-24 NOTE — ED Triage Notes (Signed)
Pt presents with laceration to left thumb. 

## 2017-12-24 NOTE — ED Provider Notes (Signed)
[facility]    CSN: [id number] Arrival date & time: 12/24/17  1300     History   Chief Complaint Chief Complaint  Patient presents with  . Laceration    Left Thumb    HPI Jose Gibson is a 32 y.o. male.   32 year old man who goes to college online, [facility], cut his left thumb while trying to open a can this morning.  Unsure of his last tetanus shot.  Patient is left-handed     Past Medical History:  Diagnosis Date  . Anxiety   . Depression     There are no active problems to display for this patient.   Past Surgical History:  Procedure Laterality Date  . CARDIAC SURGERY     5 mos. old- does not know what they did       Home Medications    Prior to Admission medications   Medication Sig Start Date End Date Taking? Authorizing Provider  ARIPiprazole (ABILIFY PO) Take by mouth.    [provider]  BuPROPion HCl (WELLBUTRIN PO) Take by mouth.    [provider]  ibuprofen (ADVIL,MOTRIN) 800 MG tablet Take 1 tablet (800 mg total) by mouth 3 (three) times daily. 03/25/12   Sunnie Nielsenpitz, Brian, MD  nystatin cream (MYCOSTATIN) Apply 1 application topically 4 (four) times daily. Apply to affected area every 4-6 hours x 10 days 05/27/15   Fayrene Helperran, Bowie, PA-C    Family History History reviewed. No pertinent family history.  Social History Social History   Tobacco Use  . Smoking status: Current Every Day Smoker    Packs/day: 2.00    Types: Cigarettes  Substance Use Topics  . Alcohol use: Yes    Alcohol/week: 2.0 standard drinks    Types: 2 Cans of beer per week  . Drug use: Yes    Types: Marijuana     Allergies   Patient has no known allergies.   Review of Systems Review of Systems  Skin: Positive for wound.  All other systems reviewed and are negative.    Physical Exam Triage Vital Signs ED Triage Vitals [12/24/17 1328]  Enc Vitals Group     BP (!) 145/82     Pulse Rate 78   Resp 18     Temp 98 F (36.7 C)     Temp Source Oral     SpO2 100 %     Weight      Height      Head Circumference      Peak Flow      Pain Score 8     Pain Loc      Pain Edu?      Excl. in GC?    No data found.  Updated Vital Signs BP (!) 145/82 (BP Location: Right Arm)   Pulse 78   Temp 98 F (36.7 C) (Oral)   Resp 18   SpO2 100%    Physical Exam  Constitutional: He is oriented to person, place, and time. He appears well-developed and well-nourished.  HENT:  Right Ear: External ear normal.  Left Ear: External ear normal.  Mouth/Throat: Oropharynx is clear and moist.  Eyes: Conjunctivae are normal.  Neck: Normal range of motion. Neck supple.  Pulmonary/Chest: Effort normal.  Musculoskeletal: Normal range of motion.  Neurological: He is alert and oriented to person, place, and time.  Skin:  1.2 cm laceration, L-shaped, volar distal phalanx of the left thumb.  Nursing  note and vitals reviewed.    UC Treatments / Results  Labs (all labs ordered are listed, but only abnormal results are displayed) Labs Reviewed - No data to display  EKG None  Radiology No results found.  Procedures Laceration Repair Date/Time: 12/24/2017 1:37 PM Performed by: Elvina Sidle, MD Authorized by: Elvina Sidle, MD   Consent:    Consent obtained:  Verbal   Consent given by:  Patient and spouse   Risks discussed:  Pain   Alternatives discussed:  No treatment Anesthesia (see MAR for exact dosages):    Anesthesia method:  Topical application   Topical anesthetic:  LET Laceration details:    Location:  Finger   Finger location:  L thumb   Length (cm):  1.2   Depth (mm):  0.4 Repair type:    Repair type:  Simple Exploration:    Contaminated: no   Treatment:    Visualized foreign bodies/material removed: no   Skin repair:    Repair method:  Tissue adhesive Approximation:    Approximation:  Close Post-procedure details:    Dressing:  Bulky dressing and tube  gauze   Patient tolerance of procedure:  Tolerated well, no immediate complications   (including critical care time)  Medications Ordered in UC Medications  lidocaine-EPINEPHrine-tetracaine (LET) solution (3 mLs Topical Given 12/24/17 1333)  Tdap (BOOSTRIX) injection 0.5 mL (0.5 mLs Intramuscular Given 12/24/17 1333)    Initial Impression / Assessment and Plan / UC Course  I have reviewed the triage vital signs and the nursing notes.  Pertinent labs & imaging results that were available during my care of the patient were reviewed by me and considered in my medical decision making (see chart for details).    Final Clinical Impressions(s) / UC Diagnoses   Final diagnoses:  Laceration of left thumb without foreign body without damage to nail, initial encounter   Discharge Instructions   None    ED Prescriptions    None     Controlled Substance Prescriptions [place] Controlled Substance Registry consulted? Not Applicable   Elvina Sidle, MD 12/24/17 1347

## 2023-05-31 ENCOUNTER — Encounter (HOSPITAL_COMMUNITY): Payer: Self-pay

## 2023-05-31 ENCOUNTER — Emergency Department (HOSPITAL_COMMUNITY)
Admission: EM | Admit: 2023-05-31 | Discharge: 2023-05-31 | Disposition: A | Discharge status: Home / Self Care | Attending: Emergency Medicine | Admitting: Emergency Medicine

## 2023-05-31 ENCOUNTER — Other Ambulatory Visit: Payer: Self-pay

## 2023-05-31 DIAGNOSIS — E871 Hypo-osmolality and hyponatremia: Secondary | ICD-10-CM | POA: Insufficient documentation

## 2023-05-31 DIAGNOSIS — R1013 Epigastric pain: Secondary | ICD-10-CM | POA: Diagnosis present

## 2023-05-31 DIAGNOSIS — E1165 Type 2 diabetes mellitus with hyperglycemia: Secondary | ICD-10-CM | POA: Insufficient documentation

## 2023-05-31 DIAGNOSIS — R739 Hyperglycemia, unspecified: Secondary | ICD-10-CM

## 2023-05-31 DIAGNOSIS — K625 Hemorrhage of anus and rectum: Secondary | ICD-10-CM | POA: Diagnosis not present

## 2023-05-31 DIAGNOSIS — K649 Unspecified hemorrhoids: Secondary | ICD-10-CM | POA: Diagnosis not present

## 2023-05-31 HISTORY — DX: Unspecified hemorrhoids: K64.9

## 2023-05-31 LAB — BLOOD GAS, VENOUS
Acid-Base Excess: 2.4 mmol/L — ABNORMAL HIGH (ref 0.0–2.0)
Bicarbonate: 28.8 mmol/L — ABNORMAL HIGH (ref 20.0–28.0)
O2 Saturation: 55.7 %
Patient temperature: 37
pCO2, Ven: 51 mmHg (ref 44–60)
pH, Ven: 7.36 (ref 7.25–7.43)
pO2, Ven: <31 mmHg — CL (ref 32–45)

## 2023-05-31 LAB — COMPREHENSIVE METABOLIC PANEL WITH GFR
ALT: 17 U/L (ref 0–44)
AST: 19 U/L (ref 15–41)
Albumin: 4.2 g/dL (ref 3.5–5.0)
Alkaline Phosphatase: 79 U/L (ref 38–126)
Anion gap: 11 (ref 5–15)
BUN: 15 mg/dL (ref 6–20)
CO2: 22 mmol/L (ref 22–32)
Calcium: 9.8 mg/dL (ref 8.9–10.3)
Chloride: 96 mmol/L — ABNORMAL LOW (ref 98–111)
Creatinine, Ser: 1.12 mg/dL (ref 0.61–1.24)
GFR, Estimated: >60 mL/min (ref >60)
Glucose, Bld: 767 mg/dL (ref 70–99)
Potassium: 4.7 mmol/L (ref 3.5–5.1)
Sodium: 129 mmol/L — ABNORMAL LOW (ref 135–145)
Total Bilirubin: 0.7 mg/dL (ref 0.0–1.2)
Total Protein: 7.3 g/dL (ref 6.5–8.1)

## 2023-05-31 LAB — CBC
HCT: 43.3 % (ref 39.0–52.0)
Hemoglobin: 14.4 g/dL (ref 13.0–17.0)
MCH: 26.9 pg (ref 26.0–34.0)
MCHC: 33.3 g/dL (ref 30.0–36.0)
MCV: 80.9 fL (ref 80.0–100.0)
Platelets: 286 10*3/uL (ref 150–400)
RBC: 5.35 MIL/uL (ref 4.22–5.81)
RDW: 14.1 % (ref 11.5–15.5)
WBC: 5.7 10*3/uL (ref 4.0–10.5)
nRBC: 0 % (ref 0.0–0.2)

## 2023-05-31 LAB — CBG MONITORING, ED
Glucose-Capillary: 573 mg/dL (ref 70–99)
Glucose-Capillary: 343 mg/dL — ABNORMAL HIGH (ref 70–99)

## 2023-05-31 LAB — HEMOGLOBIN AND HEMATOCRIT, BLOOD
HCT: 46 % (ref 39.0–52.0)
Hemoglobin: 15.2 g/dL (ref 13.0–17.0)

## 2023-05-31 LAB — TYPE AND SCREEN
ABO/RH(D): A POS
Antibody Screen: NEGATIVE

## 2023-05-31 LAB — POC OCCULT BLOOD, ED: Fecal Occult Bld: POSITIVE — AB

## 2023-05-31 MED ORDER — INSULIN GLARGINE-YFGN 100 UNIT/ML ~~LOC~~ SOLN
10.0000 [IU] | SUBCUTANEOUS | Status: AC
  Administered 2023-05-31: 10 [IU] via SUBCUTANEOUS
  Filled 2023-05-31: qty 0.1

## 2023-05-31 MED ORDER — SODIUM CHLORIDE 0.9 % IV BOLUS
2000.0000 mL | Freq: Once | INTRAVENOUS | Status: AC
  Administered 2023-05-31: 2000 mL via INTRAVENOUS

## 2023-05-31 MED ORDER — POLYETHYLENE GLYCOL 3350 17 G PO PACK: 17.0000 g | PACK | Freq: Every day | ORAL | 0 refills | Status: AC

## 2023-05-31 MED ORDER — ONDANSETRON 4 MG PO TBDP: 4.0000 mg | ORAL_TABLET | Freq: Three times a day (TID) | ORAL | 0 refills | Status: AC | PRN

## 2023-05-31 MED ORDER — METFORMIN HCL 500 MG PO TABS: 500.0000 mg | ORAL_TABLET | Freq: Two times a day (BID) | ORAL | 2 refills | Status: AC

## 2023-05-31 NOTE — ED Provider Notes (Signed)
 [place] EM[facility]ENC[facility] DEPARTMENT AT [facility] Provider Note   CSN: [id number] Arrival date & time: 05/31/23  1449     History {Add pertinent medical, surgical, social history, OB history to HPI:1} Chief Complaint  Patient presents with   Rectal Bleeding    Jose Gibson is a 38 y.o. male.  38 year old male with a history of hemorrhoids who presents emergency department with rectal bleeding.  Patient reports that today he felt very constipated.  Straining to have a bowel movement and then had a small few hard stool balls that came out.  Afterwards started having some rectal bleeding.  Says that it started going down his legs.  Had to use a paper towel on his bottom to stop the bleeding.  Afterwards had some mild epigastric abdominal pain.  No history of GI bleeding.  No on any blood thinners.  No history of NSAID use or alcohol use.  No liver disease.  His significant other also has been checking his blood sugar at home and reports over the past few months it has been in the 3-400s.  He is not on any medications for this.       Home Medications Prior to Admission medications   Medication Sig Start Date End Date Taking? Authorizing Provider  ARIPiprazole (ABILIFY) 10 MG tablet Take 10 mg by mouth daily. 05/26/23   [provider]  buPROPion (WELLBUTRIN XL) 150 MG 24 hr tablet Take 150 mg by mouth daily. 05/26/23   [provider]  traZODone (DESYREL) 50 MG tablet Take 50 mg by mouth at bedtime. 05/26/23   [provider]      Allergies    Patient has no known allergies.    Review of Systems   Review of Systems  Physical Exam Updated Vital Signs BP 137/75 (BP Location: Right Arm)   Pulse (!) 103   Temp 98.2 F (36.8 C)   Resp 18   Ht 5\' 6"  (1.676 m)   SpO2 95%  Physical Exam Vitals and nursing note reviewed.  Constitutional:      General: He is not in acute distress.    Appearance: He is well-developed.  HENT:     Head: Normocephalic  and atraumatic.     Right Ear: External ear normal.     Left Ear: External ear normal.     Nose: Nose normal.  Eyes:     Extraocular Movements: Extraocular movements intact.     Conjunctiva/sclera: Conjunctivae normal.     Pupils: Pupils are equal, round, and reactive to light.  Pulmonary:     Effort: Pulmonary effort is normal. No respiratory distress.  Abdominal:     General: There is no distension.     Palpations: Abdomen is soft. There is no mass.     Tenderness: There is abdominal tenderness (Mild epigastric). There is no guarding.  Genitourinary:    Comments: Chaperoned by patient technician Sophia.  Patient with large prolapsed internal hemorrhoid that appears friable but bleeding is currently controlled.  Very small amount of gross blood on rectal exam. Musculoskeletal:     Cervical back: Normal range of motion and neck supple.     Right lower leg: No edema.     Left lower leg: No edema.  Skin:    General: Skin is warm and dry.  Neurological:     Mental Status: He is alert. Mental status is at baseline.  Psychiatric:        Mood and Affect: Mood normal.  Behavior: Behavior normal.     ED Results / Procedures / Treatments   Labs (all labs ordered are listed, but only abnormal results are displayed) Labs Reviewed  COMPREHENSIVE METABOLIC PANEL WITH GFR - Abnormal; Notable for the following components:      Result Value   Sodium 129 (*)    Chloride 96 (*)    Glucose, Bld 767 (*)    All other components within normal limits  CBG MONITORING, ED - Abnormal; Notable for the following components:   Glucose-Capillary 573 (*)    All other components within normal limits  CBC  POC OCCULT BLOOD, ED  TYPE AND SCREEN    EKG None  Radiology No results found.  Procedures Procedures  {Document cardiac monitor, telemetry assessment procedure when appropriate:1}  Medications Ordered in ED Medications - No data to display  ED Course/ Medical Decision Making/  A&P   {   Click here for ABCD2, HEART and other calculatorsREFRESH Note before signing :1}                              Medical Decision Making Amount and/or Complexity of Data Reviewed Labs: ordered.   ***  {Document critical care time when appropriate:1} {Document review of labs and clinical decision tools ie heart score, Chads2Vasc2 etc:1}  {Document your independent review of radiology images, and any outside records:1} {Document your discussion with family members, caretakers, and with consultants:1} {Document social determinants of health affecting pt's care:1} {Document your decision making why or why not admission, treatments were needed:1} Final Clinical Impression(s) / ED Diagnoses Final diagnoses:  None    Rx / DC Orders ED Discharge Orders     None

## 2023-05-31 NOTE — ED Notes (Signed)
 Patient discharged in stable condition, education materials explained including, follow up, any prescriptions and reasons to return. Patient voiced agreement to education and discharge material.

## 2023-05-31 NOTE — ED Triage Notes (Signed)
 Pt came in via POV d/t when he had BM he reports blood was coming from his bottom & his fiance reports that  when he stood it was running down his legs & continued to when he was in the shower. Family reports it kept bleeding for approx 10-12 minutes before deciding to come into ED for eval. Does not endorse current abd pain but was having some prior to this happening.

## 2023-05-31 NOTE — Discharge Instructions (Signed)
 You were seen for your bleeding hemorrhoids and newly diagnosed diabetes in the emergency department.   At home, please take the stool softener we have given you twice a day until you are having normal bowel movements and then continue it once a day.  Please perform sitz bath's and use Preparation H if you are having rectal pain.  Take the metformin we have prescribed you for your diabetes.  Take the Zofran if you experience nausea and vomiting with it  Check your MyChart online for the results of any tests that had not resulted by the time you left the emergency department.   Follow-up with your primary doctor in 2-3 days regarding your visit.  If you do not have a primary care doctor you may follow-up with [facility] which is listed in this packet.  Return immediately to the emergency department if you experience any of the following: Severe bleeding, abdominal pain, fainting, shortness of breath, or any other concerning symptoms.    Thank you for visiting our Emergency Department. It was a pleasure taking care of you today.
